# Patient Record
Sex: Male | Born: 2005 | Race: White | Hispanic: No | Marital: Single | Smoking: Never smoker
Health system: Southern US, Community
[De-identification: ages and names within clinical notes are randomized; demographics above are authoritative.]

## PROBLEM LIST (undated history)

## (undated) DIAGNOSIS — F909 Attention-deficit hyperactivity disorder, unspecified type: Secondary | ICD-10-CM

---
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
## 2024-01-28 ENCOUNTER — Ambulatory Visit: Payer: Self-pay

## 2024-01-28 NOTE — Telephone Encounter (Signed)
 CALLED [facility] AND NO AVAILABILITY IN THE OFFICE TODAY Patient's mother did not want to take him to Urgent Care today [facility] set up an appointment for the patient tomorrow morning & they are advised that if anything gets worse   FYI Only or Action Required?: FYI only for provider.  Patient was last seen in primary care on 05/03/2023 by Lucius Krabbe, NP.  Called Nurse Triage reporting Poison Ivy.  Symptoms began over a week ago.  Interventions attempted: OTC medications: didn't specify.  Symptoms are: gradually worsening.  Triage Disposition: See HCP Within 4 Hours (Or PCP Triage)  Patient/caregiver understands and will follow disposition?: No----but this RN called [facility] and patient's mother refused to utilize Urgent Care today and set up an appointment tomorrow morning with PCP office through the [facility]                    Copied from CRM #[id number]. Topic: Clinical - Red Word Triage >> Jan 28, 2024 11:42 AM Macario HERO wrote: Red Word that prompted transfer to Nurse Triage: Patient's mother said that patient had poison oak/ poison ivy and it's spreading really bad all over his face, arms, hands and moving towards his belly. Reason for Disposition  [1] Severe poison ivy, oak, or sumac reaction in the past AND [2] face or genitals involved  Answer Assessment - Initial Assessment Questions Patient has had poison oak/ivy in the past and had to come in to get a steroid shot that helped This has been present for over a week after going into the woods Patient denies any difficulty breathing, swelling of the face/eyes/nose/throat/lips/neck His mother called in but patient is sitting right there with her They are advised that if anything worsens to go to the Emergency Room They verbalized understanding      1. APPEARANCE of RASH: What does the rash look like?      Red, bumps 2. LOCATION: Where is the rash located?  (e.g., face, genitals, hands, legs)     Arms, abdomen, face,  close to left eye 3. SIZE: How large is the rash?      varies 4. ONSET: When did the rash begin?      A little over a week ago 5. ITCHING: Does the rash itch? If Yes, ask: How bad is it?     Severely itching 6. EXPOSURE:  How were you exposed to the plant (poison ivy, poison oak, sumac)  When were you exposed?  Note: Sometimes a poison ivy/oak/sumac rash does not appear for 2 to 3 weeks after exposure.      Went into woods 7. PAST HISTORY: Have you had a poison ivy rash before? If Yes, ask: How bad was it?     Yes---came in for a steroid injection 8. OTHER SYMPTOMS: Do you have any other symptoms? (e.g., fever)      No other symptoms  Protocols used: Poison Ivy - Oak - Sumac-A-AH

## 2024-01-28 NOTE — Telephone Encounter (Signed)
 Appt tomorrow.

## 2024-01-29 ENCOUNTER — Ambulatory Visit: Admitting: Family Medicine

## 2024-02-04 ENCOUNTER — Encounter: Admitting: Physician Assistant

## 2024-04-19 ENCOUNTER — Other Ambulatory Visit: Payer: Self-pay

## 2024-04-19 ENCOUNTER — Encounter (HOSPITAL_COMMUNITY): Payer: Self-pay

## 2024-04-19 ENCOUNTER — Emergency Department (HOSPITAL_COMMUNITY)
Admission: EM | Admit: 2024-04-19 | Discharge: 2024-04-20 | Disposition: A | Discharge status: Home / Self Care | Attending: Emergency Medicine | Admitting: Emergency Medicine

## 2024-04-19 ENCOUNTER — Emergency Department (HOSPITAL_COMMUNITY)

## 2024-04-19 DIAGNOSIS — S022XXA Fracture of nasal bones, initial encounter for closed fracture: Secondary | ICD-10-CM | POA: Diagnosis not present

## 2024-04-19 DIAGNOSIS — R519 Headache, unspecified: Secondary | ICD-10-CM | POA: Diagnosis present

## 2024-04-19 DIAGNOSIS — S270XXA Traumatic pneumothorax, initial encounter: Secondary | ICD-10-CM | POA: Insufficient documentation

## 2024-04-19 DIAGNOSIS — Z23 Encounter for immunization: Secondary | ICD-10-CM | POA: Diagnosis not present

## 2024-04-19 DIAGNOSIS — Y92410 Unspecified street and highway as the place of occurrence of the external cause: Secondary | ICD-10-CM | POA: Diagnosis not present

## 2024-04-19 MED ORDER — TETANUS-DIPHTH-ACELL PERTUSSIS 5-2-15.5 LF-MCG/0.5 IM SUSP
0.5000 mL | Freq: Once | INTRAMUSCULAR | Status: AC
  Administered 2024-04-20: 0.5 mL via INTRAMUSCULAR
  Filled 2024-04-19: qty 0.5

## 2024-04-19 MED ORDER — HYDROMORPHONE HCL 1 MG/ML IJ SOLN: 1.0000 mg | Freq: Once | INTRAMUSCULAR | Status: DC
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
## 2024-04-20 ENCOUNTER — Emergency Department (HOSPITAL_COMMUNITY)

## 2024-04-20 LAB — CBC WITH DIFFERENTIAL/PLATELET
Abs Immature Granulocytes: 0.07 K/uL (ref 0.00–0.07)
Basophils Absolute: 0 K/uL (ref 0.0–0.1)
Basophils Relative: 0 %
Eosinophils Absolute: 0 K/uL (ref 0.0–0.5)
Eosinophils Relative: 0 %
HCT: 42.5 % (ref 39.0–52.0)
Hemoglobin: 14.7 g/dL (ref 13.0–17.0)
Immature Granulocytes: 1 %
Lymphocytes Relative: 20 %
Lymphs Abs: 2.4 K/uL (ref 0.7–4.0)
MCH: 30.2 pg (ref 26.0–34.0)
MCHC: 34.6 g/dL (ref 30.0–36.0)
MCV: 87.4 fL (ref 80.0–100.0)
Monocytes Absolute: 1.1 K/uL — ABNORMAL HIGH (ref 0.1–1.0)
Monocytes Relative: 9 %
Neutro Abs: 8.4 K/uL — ABNORMAL HIGH (ref 1.7–7.7)
Neutrophils Relative %: 70 %
Platelets: 221 K/uL (ref 150–400)
RBC: 4.86 MIL/uL (ref 4.22–5.81)
RDW: 11.6 % (ref 11.5–15.5)
WBC: 12 K/uL — ABNORMAL HIGH (ref 4.0–10.5)
nRBC: 0 % (ref 0.0–0.2)

## 2024-04-20 LAB — COMPREHENSIVE METABOLIC PANEL WITH GFR
ALT: 26 U/L (ref 0–44)
AST: 38 U/L (ref 15–41)
Albumin: 4.5 g/dL (ref 3.5–5.0)
Alkaline Phosphatase: 85 U/L (ref 38–126)
Anion gap: 13 (ref 5–15)
BUN: 15 mg/dL (ref 6–20)
CO2: 24 mmol/L (ref 22–32)
Calcium: 9.3 mg/dL (ref 8.9–10.3)
Chloride: 103 mmol/L (ref 98–111)
Creatinine, Ser: 0.96 mg/dL (ref 0.61–1.24)
GFR, Estimated: >60 mL/min (ref >60)
Glucose, Bld: 123 mg/dL — ABNORMAL HIGH (ref 70–99)
Potassium: 2.9 mmol/L — ABNORMAL LOW (ref 3.5–5.1)
Sodium: 140 mmol/L (ref 135–145)
Total Bilirubin: 0.5 mg/dL (ref 0.0–1.2)
Total Protein: 7.1 g/dL (ref 6.5–8.1)

## 2024-04-20 MED ORDER — OXYCODONE-ACETAMINOPHEN 5-325 MG PO TABS: 1.0000 | ORAL_TABLET | Freq: Four times a day (QID) | ORAL | 0 refills | Status: DC | PRN

## 2024-04-20 MED ORDER — HYDROMORPHONE HCL 1 MG/ML IJ SOLN
1.0000 mg | Freq: Once | INTRAMUSCULAR | Status: AC
  Administered 2024-04-20: 1 mg via INTRAMUSCULAR
  Filled 2024-04-20: qty 1

## 2024-04-20 MED ORDER — IOHEXOL 300 MG/ML  SOLN
100.0000 mL | Freq: Once | INTRAMUSCULAR | Status: AC | PRN
  Administered 2024-04-20: 100 mL via INTRAVENOUS

## 2024-04-20 MED ORDER — HYDROMORPHONE HCL 1 MG/ML IJ SOLN
1.0000 mg | Freq: Once | INTRAMUSCULAR | Status: DC
  Filled 2024-04-20: qty 1
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
## 2024-04-20 NOTE — ED Notes (Signed)
 Patient transported to CT

## 2024-04-20 NOTE — ED Notes (Addendum)
 Pt vomited- says he feels better, -Dr Lorette made aware. pt mother at bedside- pt given gown, and soapy water/wash cloths to clean up with.

## 2024-04-28 ENCOUNTER — Encounter: Payer: Self-pay | Admitting: Physician Assistant

## 2024-04-28 ENCOUNTER — Ambulatory Visit: Admitting: Physician Assistant

## 2024-04-28 ENCOUNTER — Ambulatory Visit (INDEPENDENT_AMBULATORY_CARE_PROVIDER_SITE_OTHER)

## 2024-04-28 DIAGNOSIS — R739 Hyperglycemia, unspecified: Secondary | ICD-10-CM | POA: Diagnosis not present

## 2024-04-28 DIAGNOSIS — S022XXS Fracture of nasal bones, sequela: Secondary | ICD-10-CM

## 2024-04-28 DIAGNOSIS — R040 Epistaxis: Secondary | ICD-10-CM | POA: Diagnosis not present

## 2024-04-28 DIAGNOSIS — R079 Chest pain, unspecified: Secondary | ICD-10-CM

## 2024-04-28 DIAGNOSIS — E876 Hypokalemia: Secondary | ICD-10-CM
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
## 2024-04-29 ENCOUNTER — Telehealth: Payer: Self-pay

## 2024-04-29 ENCOUNTER — Ambulatory Visit: Payer: Self-pay | Admitting: Physician Assistant

## 2024-04-29 LAB — CBC WITH DIFFERENTIAL/PLATELET
Basophils Absolute: 0 K/uL (ref 0.0–0.1)
Basophils Relative: 0.9 % (ref 0.0–3.0)
Eosinophils Absolute: 0.1 K/uL (ref 0.0–0.7)
Eosinophils Relative: 2.1 % (ref 0.0–5.0)
HCT: 44.5 % (ref 36.0–49.0)
Hemoglobin: 15.4 g/dL (ref 12.0–16.0)
Lymphocytes Relative: 39.5 % (ref 24.0–48.0)
Lymphs Abs: 2.3 K/uL (ref 0.7–4.0)
MCHC: 34.5 g/dL (ref 31.0–37.0)
MCV: 89.1 fl (ref 78.0–98.0)
Monocytes Absolute: 0.5 K/uL (ref 0.1–1.0)
Monocytes Relative: 8.7 % (ref 3.0–12.0)
Neutro Abs: 2.8 K/uL (ref 1.4–7.7)
Neutrophils Relative %: 48.8 % (ref 43.0–71.0)
Platelets: 255 K/uL (ref 150.0–575.0)
RBC: 5 Mil/uL (ref 3.80–5.70)
RDW: 12.4 % (ref 11.4–15.5)
WBC: 5.7 K/uL (ref 4.5–13.5)

## 2024-04-29 LAB — COMPREHENSIVE METABOLIC PANEL WITH GFR
ALT: 21 U/L (ref 0–53)
AST: 21 U/L (ref 0–37)
Albumin: 5 g/dL (ref 3.5–5.2)
Alkaline Phosphatase: 96 U/L (ref 52–171)
BUN: 10 mg/dL (ref 6–23)
CO2: 29 meq/L (ref 19–32)
Calcium: 9.9 mg/dL (ref 8.4–10.5)
Chloride: 101 meq/L (ref 96–112)
Creatinine, Ser: 0.94 mg/dL (ref 0.40–1.50)
GFR: 117.62 mL/min (ref >60.00)
Glucose, Bld: 93 mg/dL (ref 70–99)
Potassium: 4 meq/L (ref 3.5–5.1)
Sodium: 140 meq/L (ref 135–145)
Total Bilirubin: 0.5 mg/dL (ref 0.2–1.2)
Total Protein: 8 g/dL (ref 6.0–8.3)

## 2024-04-29 LAB — HEMOGLOBIN A1C: Hgb A1c MFr Bld: 5.5 % (ref 4.6–6.5)

## 2024-04-29 LAB — IBC + FERRITIN
Ferritin: 77.2 ng/mL (ref 22.0–322.0)
Iron: 82 ug/dL (ref 42–165)
Saturation Ratios: 22.4 % (ref 20.0–50.0)
TIBC: 365.4 ug/dL (ref 250.0–450.0)
Transferrin: 261 mg/dL (ref 212.0–360.0)

## 2024-04-29 NOTE — Telephone Encounter (Signed)
 Copied from CRM [id number]. Topic: Referral - Question >> Apr 29, 2024 11:25 AM Burnard DEL wrote: Reason for CRM: [facility] ENT called in stating that they received referral on behalf d patient from [facility] since patient was seen in ED ,at that time [facility] was the office on call and patient has to FU with [facility] EN per hospital protocol.. They have tried to call patient but can't get in contact with him.  Per patient Mom pt isn't scheduled to see [facility] until 05/22/24 and that is consultation only. [facility] and got patient worked in to see Dr Jesus tomorrow 04/30/24 at 10:40am. Pt Mom has been advised of this appt and location.

## 2024-04-30 NOTE — Telephone Encounter (Signed)
 Noted and agreed, thank you.

## 2024-06-17 ENCOUNTER — Other Ambulatory Visit (HOSPITAL_BASED_OUTPATIENT_CLINIC_OR_DEPARTMENT_OTHER): Payer: Self-pay

## 2024-06-17 ENCOUNTER — Encounter: Payer: Self-pay | Admitting: Physician Assistant

## 2024-06-17 ENCOUNTER — Other Ambulatory Visit: Payer: Self-pay

## 2024-06-17 MED ORDER — OMEPRAZOLE 20 MG PO CPDR
20.0000 mg | DELAYED_RELEASE_CAPSULE | Freq: Every day | ORAL | 3 refills | Status: AC
  Filled 2024-06-17: qty 30, 30d supply, fill #0

## 2024-06-17 MED ORDER — CETIRIZINE HCL 10 MG PO TABS
10.0000 mg | ORAL_TABLET | Freq: Every day | ORAL | 3 refills | Status: AC
  Filled 2024-06-17: qty 90, 90d supply, fill #0

## 2024-07-22 ENCOUNTER — Other Ambulatory Visit: Payer: Self-pay | Admitting: Otolaryngology

## 2024-07-24 ENCOUNTER — Encounter (HOSPITAL_COMMUNITY): Payer: Self-pay | Admitting: Otolaryngology

## 2024-07-24 ENCOUNTER — Other Ambulatory Visit: Payer: Self-pay

## 2024-07-24 NOTE — Progress Notes (Signed)
 SDW CALL  Patient was given pre-op instructions over the phone. The opportunity was given for the patient to ask questions. No further questions asked. Patient verbalized understanding of instructions given.  Date & arrival time- July 29, 2024 @ 5:30 am.  PCP - Allwardt, Mardy HERO, PA-C  Cardiologist -   PPM/ICD - denies Device Orders - n/a Rep Notified - n/a  Chest x-ray - denies EKG - denies Stress Test - denies ECHO - denies Cardiac Cath - denies  Sleep Study - denies CPAP - n/a  DM -denies  Blood Thinner Instructions: denies Aspirin Instructions:denies  ERAS Protcol - NPO per mom   COVID TEST- n/a   Anesthesia review: no  Patient denies shortness of breath, fever, cough and chest pain over the phone call   All instructions explained to the patient, with a verbal understanding of the material. Patient agrees to go over the instructions while at home for a better understanding.

## 2024-07-28 NOTE — Anesthesia Preprocedure Evaluation (Signed)
"                                    Anesthesia Evaluation  Patient identified by MRN, date of birth, ID band Patient awake    Reviewed: Allergy & Precautions, NPO status , Patient's Chart, lab work & pertinent test results  History of Anesthesia Complications Negative for: history of anesthetic complications  Airway Mallampati: II  TM Distance: >3 FB Neck ROM: Full    Dental  (+) Dental Advisory Given   Pulmonary neg pulmonary ROS   breath sounds clear to auscultation       Cardiovascular (-) angina negative cardio ROS  Rhythm:Regular Rate:Normal     Neuro/Psych  Headaches PSYCHIATRIC DISORDERS (ADHD)         GI/Hepatic Neg liver ROS,GERD  Controlled and Medicated,,  Endo/Other  negative endocrine ROS    Renal/GU negative Renal ROS     Musculoskeletal   Abdominal   Peds  Hematology negative hematology ROS (+)   Anesthesia Other Findings S/p cleft lip and palate repair, has naso-palatal fistula  Reproductive/Obstetrics                              Anesthesia Physical Anesthesia Plan  ASA: 2  Anesthesia Plan: General   Post-op Pain Management: Tylenol  PO (pre-op)*   Induction: Intravenous  PONV Risk Score and Plan: 2 and Ondansetron  and Dexamethasone   Airway Management Planned: Oral ETT  Additional Equipment: None  Intra-op Plan:   Post-operative Plan: Extubation in OR  Informed Consent: I have reviewed the patients History and Physical, chart, labs and discussed the procedure including the risks, benefits and alternatives for the proposed anesthesia with the patient or authorized representative who has indicated his/her understanding and acceptance.     Dental advisory given  Plan Discussed with: CRNA and Surgeon  Anesthesia Plan Comments:          Anesthesia Quick Evaluation  "

## 2024-07-29 ENCOUNTER — Encounter (HOSPITAL_COMMUNITY): Payer: Self-pay | Admitting: Otolaryngology

## 2024-07-29 ENCOUNTER — Encounter (HOSPITAL_COMMUNITY): Admitting: Anesthesiology

## 2024-07-29 ENCOUNTER — Other Ambulatory Visit: Payer: Self-pay

## 2024-07-29 ENCOUNTER — Ambulatory Visit (HOSPITAL_COMMUNITY)
Admission: RE | Admit: 2024-07-29 | Discharge: 2024-07-29 | Disposition: A | Discharge status: Home / Self Care | Attending: Otolaryngology | Admitting: Otolaryngology

## 2024-07-29 ENCOUNTER — Ambulatory Visit (HOSPITAL_BASED_OUTPATIENT_CLINIC_OR_DEPARTMENT_OTHER): Admitting: Anesthesiology

## 2024-07-29 ENCOUNTER — Encounter (HOSPITAL_COMMUNITY)
Admission: RE | Disposition: A | Discharge status: Home / Self Care | Payer: Self-pay | Source: Home / Self Care | Attending: Otolaryngology

## 2024-07-29 ENCOUNTER — Other Ambulatory Visit (HOSPITAL_BASED_OUTPATIENT_CLINIC_OR_DEPARTMENT_OTHER): Payer: Self-pay

## 2024-07-29 DIAGNOSIS — J342 Deviated nasal septum: Secondary | ICD-10-CM | POA: Insufficient documentation

## 2024-07-29 DIAGNOSIS — Q302 Fissured, notched and cleft nose: Secondary | ICD-10-CM | POA: Insufficient documentation

## 2024-07-29 DIAGNOSIS — J34829 Nasal valve collapse, unspecified: Secondary | ICD-10-CM | POA: Insufficient documentation

## 2024-07-29 DIAGNOSIS — Z79899 Other long term (current) drug therapy: Secondary | ICD-10-CM | POA: Insufficient documentation

## 2024-07-29 DIAGNOSIS — K219 Gastro-esophageal reflux disease without esophagitis: Secondary | ICD-10-CM | POA: Diagnosis not present

## 2024-07-29 DIAGNOSIS — Q379 Unspecified cleft palate with unilateral cleft lip: Secondary | ICD-10-CM | POA: Diagnosis present

## 2024-07-29 DIAGNOSIS — M950 Acquired deformity of nose: Secondary | ICD-10-CM | POA: Diagnosis not present

## 2024-07-29 DIAGNOSIS — F909 Attention-deficit hyperactivity disorder, unspecified type: Secondary | ICD-10-CM | POA: Diagnosis not present

## 2024-07-29 DIAGNOSIS — J343 Hypertrophy of nasal turbinates: Secondary | ICD-10-CM | POA: Insufficient documentation

## 2024-07-29 HISTORY — DX: Attention-deficit hyperactivity disorder, unspecified type: F90.9

## 2024-07-29 LAB — CBC
HCT: 43 % (ref 39.0–52.0)
Hemoglobin: 15.2 g/dL (ref 13.0–17.0)
MCH: 31 pg (ref 26.0–34.0)
MCHC: 35.3 g/dL (ref 30.0–36.0)
MCV: 87.6 fL (ref 80.0–100.0)
Platelets: 218 10*3/uL (ref 150–400)
RBC: 4.91 MIL/uL (ref 4.22–5.81)
RDW: 11.6 % (ref 11.5–15.5)
WBC: 5.7 10*3/uL (ref 4.0–10.5)
nRBC: 0 % (ref 0.0–0.2)

## 2024-07-29 MED ORDER — MEPERIDINE HCL 25 MG/ML IJ SOLN: 6.2500 mg | INTRAMUSCULAR | Status: DC | PRN

## 2024-07-29 MED ORDER — ACETAMINOPHEN 500 MG PO TABS
ORAL_TABLET | ORAL | Status: AC
  Administered 2024-07-29: 1000 mg via ORAL
  Filled 2024-07-29: qty 2

## 2024-07-29 MED ORDER — PHENYLEPHRINE 80 MCG/ML (10ML) SYRINGE FOR IV PUSH (FOR BLOOD PRESSURE SUPPORT)
PREFILLED_SYRINGE | INTRAVENOUS | Status: AC
  Filled 2024-07-29: qty 10

## 2024-07-29 MED ORDER — HYDROMORPHONE HCL 1 MG/ML IJ SOLN
INTRAMUSCULAR | Status: AC
  Filled 2024-07-29: qty 1

## 2024-07-29 MED ORDER — MUPIROCIN 2 % EX OINT
TOPICAL_OINTMENT | CUTANEOUS | Status: AC
  Filled 2024-07-29: qty 22

## 2024-07-29 MED ORDER — FLUORESCEIN SODIUM 1 MG OP STRP
ORAL_STRIP | OPHTHALMIC | Status: AC
  Filled 2024-07-29: qty 3

## 2024-07-29 MED ORDER — GENTAMICIN SULFATE 40 MG/ML IJ SOLN
INTRAMUSCULAR | Status: AC
  Filled 2024-07-29: qty 2

## 2024-07-29 MED ORDER — FENTANYL CITRATE (PF) 250 MCG/5ML IJ SOLN
INTRAMUSCULAR | Status: DC | PRN
  Administered 2024-07-29: 100 ug via INTRAVENOUS
  Administered 2024-07-29 (×3): 50 ug via INTRAVENOUS

## 2024-07-29 MED ORDER — LIDOCAINE 2% (20 MG/ML) 5 ML SYRINGE
INTRAMUSCULAR | Status: AC
  Filled 2024-07-29: qty 5

## 2024-07-29 MED ORDER — CHLORHEXIDINE GLUCONATE 0.12 % MT SOLN
OROMUCOSAL | Status: AC
  Administered 2024-07-29: 15 mL via OROMUCOSAL
  Filled 2024-07-29: qty 15

## 2024-07-29 MED ORDER — DEXAMETHASONE SOD PHOSPHATE PF 10 MG/ML IJ SOLN
INTRAMUSCULAR | Status: AC
  Filled 2024-07-29: qty 1

## 2024-07-29 MED ORDER — LIDOCAINE 2% (20 MG/ML) 5 ML SYRINGE
INTRAMUSCULAR | Status: DC | PRN
  Administered 2024-07-29: 60 mg via INTRAVENOUS

## 2024-07-29 MED ORDER — DEXAMETHASONE SOD PHOSPHATE PF 10 MG/ML IJ SOLN
INTRAMUSCULAR | Status: DC | PRN
  Administered 2024-07-29: 10 mg via INTRAVENOUS

## 2024-07-29 MED ORDER — FLUORESCEIN SODIUM 1 MG OP STRP
ORAL_STRIP | OPHTHALMIC | Status: DC | PRN
  Administered 2024-07-29: 1

## 2024-07-29 MED ORDER — EPINEPHRINE HCL (NASAL) 0.1 % NA SOLN
NASAL | Status: AC
  Filled 2024-07-29: qty 30

## 2024-07-29 MED ORDER — OXYCODONE HCL 5 MG PO TABS: 5.0000 mg | ORAL_TABLET | Freq: Once | ORAL | Status: DC | PRN

## 2024-07-29 MED ORDER — CEFAZOLIN SODIUM-DEXTROSE 2-3 GM-%(50ML) IV SOLR
INTRAVENOUS | Status: DC | PRN
  Administered 2024-07-29: 2 g via INTRAVENOUS

## 2024-07-29 MED ORDER — SALINE SPRAY 0.65 % NA SOLN
2.0000 | Freq: Four times a day (QID) | NASAL | 0 refills | Status: AC
  Filled 2024-07-29: qty 44, 30d supply, fill #0

## 2024-07-29 MED ORDER — OXYCODONE HCL 5 MG/5ML PO SOLN: 5.0000 mg | Freq: Once | ORAL | Status: DC | PRN

## 2024-07-29 MED ORDER — EPHEDRINE 5 MG/ML INJ
INTRAVENOUS | Status: AC
  Filled 2024-07-29: qty 5

## 2024-07-29 MED ORDER — METHYLPREDNISOLONE 4 MG PO TBPK
ORAL_TABLET | ORAL | 0 refills | Status: AC
  Filled 2024-07-29: qty 21, 6d supply, fill #0

## 2024-07-29 MED ORDER — ONDANSETRON HCL 4 MG/2ML IJ SOLN
INTRAMUSCULAR | Status: DC | PRN
  Administered 2024-07-29: 4 mg via INTRAVENOUS

## 2024-07-29 MED ORDER — CHLORHEXIDINE GLUCONATE 0.12 % MT SOLN: 15.0000 mL | Freq: Once | OROMUCOSAL | Status: AC

## 2024-07-29 MED ORDER — ONDANSETRON HCL 4 MG/2ML IJ SOLN
INTRAMUSCULAR | Status: AC
  Filled 2024-07-29: qty 2

## 2024-07-29 MED ORDER — ORAL CARE MOUTH RINSE: 15.0000 mL | Freq: Once | OROMUCOSAL | Status: AC

## 2024-07-29 MED ORDER — BACITRACIN ZINC 500 UNIT/GM EX OINT
TOPICAL_OINTMENT | CUTANEOUS | Status: AC
  Filled 2024-07-29: qty 28.35

## 2024-07-29 MED ORDER — OXYCODONE HCL 5 MG PO TABS
5.0000 mg | ORAL_TABLET | Freq: Four times a day (QID) | ORAL | 0 refills | Status: AC | PRN
  Filled 2024-07-29: qty 5, 2d supply, fill #0

## 2024-07-29 MED ORDER — ACETAMINOPHEN 500 MG PO TABS: 1000.0000 mg | ORAL_TABLET | Freq: Once | ORAL | Status: AC

## 2024-07-29 MED ORDER — MIDAZOLAM HCL 2 MG/2ML IJ SOLN
INTRAMUSCULAR | Status: AC
  Filled 2024-07-29: qty 2

## 2024-07-29 MED ORDER — MUPIROCIN 2 % EX OINT
1.0000 | TOPICAL_OINTMENT | Freq: Two times a day (BID) | CUTANEOUS | 0 refills | Status: AC
  Filled 2024-07-29: qty 22, 14d supply, fill #0

## 2024-07-29 MED ORDER — HYDROMORPHONE HCL 1 MG/ML IJ SOLN
INTRAMUSCULAR | Status: DC | PRN
  Administered 2024-07-29: .5 mg via INTRAVENOUS

## 2024-07-29 MED ORDER — SODIUM CHLORIDE 0.9 % IR SOLN
Status: DC | PRN
  Administered 2024-07-29: 2000 mL

## 2024-07-29 MED ORDER — MIDAZOLAM HCL (PF) 2 MG/2ML IJ SOLN
INTRAMUSCULAR | Status: DC | PRN
  Administered 2024-07-29: 2 mg via INTRAVENOUS

## 2024-07-29 MED ORDER — ROCURONIUM BROMIDE 10 MG/ML (PF) SYRINGE
PREFILLED_SYRINGE | INTRAVENOUS | Status: AC
  Filled 2024-07-29: qty 10

## 2024-07-29 MED ORDER — MIDAZOLAM HCL (PF) 2 MG/2ML IJ SOLN: 0.5000 mg | Freq: Once | INTRAMUSCULAR | Status: DC | PRN

## 2024-07-29 MED ORDER — HYDROMORPHONE HCL 1 MG/ML IJ SOLN
INTRAMUSCULAR | Status: AC
  Filled 2024-07-29: qty 0.5

## 2024-07-29 MED ORDER — BACITRACIN ZINC 500 UNIT/GM EX OINT
TOPICAL_OINTMENT | CUTANEOUS | Status: DC | PRN
  Administered 2024-07-29: 1 via TOPICAL

## 2024-07-29 MED ORDER — SUZETRIGINE 50 MG PO TABS
50.0000 mg | ORAL_TABLET | Freq: Two times a day (BID) | ORAL | 0 refills | Status: AC
  Filled 2024-07-29: qty 14, 7d supply, fill #0

## 2024-07-29 MED ORDER — DEXMEDETOMIDINE HCL IN NACL 80 MCG/20ML IV SOLN
INTRAVENOUS | Status: AC
  Filled 2024-07-29: qty 20

## 2024-07-29 MED ORDER — EPINEPHRINE 0.1 % (1MG/ML) IJ FOR NASAL USE
INTRAMUSCULAR | Status: DC | PRN
  Administered 2024-07-29: 1 [drp] via TOPICAL

## 2024-07-29 MED ORDER — BSS IO SOLN
INTRAOCULAR | Status: AC
  Filled 2024-07-29: qty 30

## 2024-07-29 MED ORDER — SUGAMMADEX SODIUM 200 MG/2ML IV SOLN
INTRAVENOUS | Status: DC | PRN
  Administered 2024-07-29: 200 mg via INTRAVENOUS

## 2024-07-29 MED ORDER — HYDROMORPHONE HCL 1 MG/ML IJ SOLN
0.2500 mg | INTRAMUSCULAR | Status: DC | PRN
  Administered 2024-07-29 (×3): 0.5 mg via INTRAVENOUS
  Administered 2024-07-29 (×2): 0.25 mg via INTRAVENOUS

## 2024-07-29 MED ORDER — LIDOCAINE-EPINEPHRINE 1 %-1:100000 IJ SOLN
INTRAMUSCULAR | Status: DC | PRN
  Administered 2024-07-29: 10 mL

## 2024-07-29 MED ORDER — LACTATED RINGERS IV SOLN: INTRAVENOUS | Status: DC

## 2024-07-29 MED ORDER — PROPOFOL 10 MG/ML IV BOLUS
INTRAVENOUS | Status: AC
  Filled 2024-07-29: qty 20

## 2024-07-29 MED ORDER — PROPOFOL 10 MG/ML IV BOLUS
INTRAVENOUS | Status: DC | PRN
  Administered 2024-07-29: 200 mg via INTRAVENOUS

## 2024-07-29 MED ORDER — DEXMEDETOMIDINE HCL IN NACL 80 MCG/20ML IV SOLN
INTRAVENOUS | Status: DC | PRN
  Administered 2024-07-29 (×2): 4 ug via INTRAVENOUS
  Administered 2024-07-29: 8 ug via INTRAVENOUS
  Administered 2024-07-29: 4 ug via INTRAVENOUS

## 2024-07-29 MED ORDER — 0.9 % SODIUM CHLORIDE (POUR BTL) OPTIME
TOPICAL | Status: DC | PRN
  Administered 2024-07-29: 1000 mL

## 2024-07-29 MED ORDER — LIDOCAINE-EPINEPHRINE 1 %-1:100000 IJ SOLN
INTRAMUSCULAR | Status: AC
  Filled 2024-07-29: qty 2

## 2024-07-29 MED ORDER — PHENYLEPHRINE 80 MCG/ML (10ML) SYRINGE FOR IV PUSH (FOR BLOOD PRESSURE SUPPORT)
PREFILLED_SYRINGE | INTRAVENOUS | Status: DC | PRN
  Administered 2024-07-29: 160 ug via INTRAVENOUS

## 2024-07-29 MED ORDER — ROCURONIUM BROMIDE 10 MG/ML (PF) SYRINGE
PREFILLED_SYRINGE | INTRAVENOUS | Status: DC | PRN
  Administered 2024-07-29: 60 mg via INTRAVENOUS
  Administered 2024-07-29: 30 mg via INTRAVENOUS
  Administered 2024-07-29 (×2): 20 mg via INTRAVENOUS

## 2024-07-29 MED ORDER — FENTANYL CITRATE (PF) 250 MCG/5ML IJ SOLN
INTRAMUSCULAR | Status: AC
  Filled 2024-07-29: qty 5

## 2024-07-29 NOTE — Anesthesia Postprocedure Evaluation (Signed)
"   Anesthesia Post Note  Patient: Edgar A Schipani Jr.  Procedure(s) Performed: RECONSTRUCTION, NOSE, WITH NASAL SEPTUM REPAIR (Bilateral: Face) SEPTOPLASTY, NOSE, WITH NASAL TURBINATE REDUCTION (Bilateral: Face)     Patient location during evaluation: PACU Anesthesia Type: General Level of consciousness: awake and alert, oriented and patient cooperative Pain management: pain level controlled Vital Signs Assessment: post-procedure vital signs reviewed and stable Respiratory status: spontaneous breathing, nonlabored ventilation and respiratory function stable Cardiovascular status: blood pressure returned to baseline and stable Postop Assessment: no apparent nausea or vomiting and able to ambulate Anesthetic complications: no   No notable events documented.  Last Vitals:  Vitals:   07/29/24 1415 07/29/24 1430  BP: (!) 143/88 (!) 145/91  Pulse: 95 98  Resp: 16 17  Temp:  37 C  SpO2: 94% 97%    Last Pain:  Vitals:   07/29/24 1430  TempSrc:   PainSc: 4                  Dantavious Snowball,E. Kody Brandl      "

## 2024-07-29 NOTE — Transfer of Care (Signed)
 Immediate Anesthesia Transfer of Care Note  Patient: Edgar Krutz Daley Jr.  Procedure(s) Performed: RECONSTRUCTION, NOSE, WITH NASAL SEPTUM REPAIR (Bilateral: Face) SEPTOPLASTY, NOSE, WITH NASAL TURBINATE REDUCTION (Bilateral: Face)  Patient Location: PACU  Anesthesia Type:General  Level of Consciousness: awake, alert , and oriented  Airway & Oxygen Therapy: Patient Spontanous Breathing and Patient connected to face mask oxygen  Post-op Assessment: Report given to RN and Post -op Vital signs reviewed and stable  Post vital signs: Reviewed and stable  Last Vitals:  Vitals Value Taken Time  BP 122/67 07/29/24 12:37  Temp    Pulse 107 07/29/24 12:41  Resp 18 07/29/24 12:41  SpO2 95 % 07/29/24 12:41  Vitals shown include unfiled device data.  Last Pain:  Vitals:   07/29/24 0613  TempSrc: Oral  PainSc:          Complications: No notable events documented.

## 2024-07-29 NOTE — Anesthesia Procedure Notes (Signed)
 Procedure Name: Intubation Date/Time: 07/29/2024 7:51 AM  Performed by: Elby Raelene SAUNDERS, CRNAPre-anesthesia Checklist: Patient identified, Emergency Drugs available, Suction available and Patient being monitored Patient Re-evaluated:Patient Re-evaluated prior to induction Oxygen Delivery Method: Circle System Utilized Preoxygenation: Pre-oxygenation with 100% oxygen Induction Type: IV induction Ventilation: Mask ventilation without difficulty Laryngoscope Size: Miller and 2 Grade View: Grade I Tube type: Oral Tube size: 7.5 mm Number of attempts: 1 Airway Equipment and Method: Stylet Placement Confirmation: ETT inserted through vocal cords under direct vision, positive ETCO2 and breath sounds checked- equal and bilateral Secured at: 23 cm Tube secured with: Tape Dental Injury: Teeth and Oropharynx as per pre-operative assessment

## 2024-07-29 NOTE — H&P (Signed)
 Edgar A Brumley Mickey. is an 19 y.o. male.    Chief Complaint:  Nasal deformity, nasal obstruction  HPI: Patient presents today for planned elective procedure.  He/she denies any interval change in history since office visit on 07/21/24.   Past Medical History:  Diagnosis Date   ADHD (attention deficit hyperactivity disorder)     History reviewed. No pertinent surgical history.  History reviewed. No pertinent family history.  Social History:  reports that he has never smoked. He has never used smokeless tobacco. He reports that he does not drink alcohol  and does not use drugs.  Allergies: Allergies[1]  Medications Prior to Admission  Medication Sig Dispense Refill   cetirizine  (ZYRTEC ) 10 MG tablet Take 1 tablet (10 mg total) by mouth daily. 90 tablet 3   Multiple Vitamins-Minerals (MULTIVITAL PO) Take 1 tablet by mouth daily.     omeprazole  (PRILOSEC) 20 MG capsule Take 1 capsule (20 mg total) by mouth daily. 90 capsule 3   nortriptyline  (PAMELOR ) 25 MG capsule TAKE 1 CAPSULE BY MOUTH AT BEDTIME. (Patient taking differently: Take 25 mg by mouth at bedtime. PRN) 90 capsule 1    Results for orders placed or performed during the hospital encounter of 07/29/24 (from the past 48 hours)  CBC per protocol     Status: None   Collection Time: 07/29/24  5:50 AM  Result Value Ref Range   WBC 5.7 4.0 - 10.5 K/uL   RBC 4.91 4.22 - 5.81 MIL/uL   Hemoglobin 15.2 13.0 - 17.0 g/dL   HCT 56.9 60.9 - 47.9 %   MCV 87.6 80.0 - 100.0 fL   MCH 31.0 26.0 - 34.0 pg   MCHC 35.3 30.0 - 36.0 g/dL   RDW 88.3 88.4 - 84.4 %   Platelets 218 150 - 400 K/uL   nRBC 0.0 0.0 - 0.2 %    Comment: Performed at [facility], [address], [place], [place] [address]   No results found.  ROS: negative other than stated in HPI  Blood pressure 129/78, pulse 75, temperature 98.1 F (36.7 C), temperature source Oral, resp. rate 18, height 6' (1.829 m), weight 69.9 kg, SpO2 98%.  PHYSICAL EXAM: General:  Resting comfortably in NAD  Lungs: Non-labored respiratinos  Studies Reviewed: None   Assessment/Plan Cleft lip nasal deformity Deviated nasal septum Nasal valve stenosis Inferior turbinate hypertrophy  Proceed with open septorhinoplasty with inferior turbinate reductions, possible cadaveric rib graft. Informed consent obtained.     Electronically signed by:  Elspeth Coddington, MD  Facial Plastic & Reconstructive Surgery Otolaryngology - Head and Neck Surgery [facility]  07/29/2024, 7:29 AM       [1]  Allergies Allergen Reactions   Penicillins Dermatitis   Amoxicillin Rash   Other     Seasonal allergies
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
## 2024-07-29 NOTE — Discharge Instructions (Signed)
 ELSPETH KANDICE CODDINGTON MD  FACIAL PLASTIC AND RECONSTRUCTIVE SURGERY    AFTER RHINOPLASTY SURGERY   After rhinoplasty surgery your nose will have silicone doyle splints secured inside.    The nasal splints will impact breathing through your nose so you may have to breathe through your mouth. Your mouth will become very dry. Please drink as much fluid as you can which will help you from becoming dehydrated. Drinks at the bedside along with a humidifier (cool or warm) may help. You will have a gauze drip pad placed beneath your nose. Change this as needed for the first 24 hours following rhinoplasty surgery. It is not uncommon to change this every 15 minutes for the first several hours following rhinoplasty. If you completely saturate the pad with bright red blood every five minutes, please call your surgeon at the numbers provided.  The cast must remain on your nose for one week after rhinoplasty surgery. It must be kept dry or it could become loose. Notify Dr. Coddington immediately if the cast falls off.  Activity Sleep with head of the bed elevated or use two to three pillows. Sneeze with your mouth open and do not blow your nose of sniff for seven days after your rhinoplasty procedure. Absolutely no bending, lifting or straining. If you have little children, bend at the knees or sit on the floor and let them climb on to your lap.  Ensure that care is taken to keep the cast dry while bathing is important.   Diet Advance diet from liquids to soft food to your regular diet as tolerated. In the immediate rhinoplasty postoperative period, avoid extremely hot liquids or foods if you experience temporary numbness on the roof of the mouth.    Ice During the day and evening of surgery, cold moist compresses are used continuously over the eyes to minimize swelling and control bruising. Puffiness and bruising can occur but if present usually regresses quickly over the next few days. There are several techniques for icing  which are effective. The glove with ice and cool compresses are the preferred methods.  Icing for 24-48 hours is recommended, icing after this period can be used for comfort.  See video on right.   Breathing:  Invariably, there is some nasal stuffiness during the week after surgery. The external edema (swelling) is reflected internally, but the mild blockage will improve steadily. it is imperative to avoid extensive manipulation inside of the nose. DO NOT BLOW THE NOSE. DO NOT USE NOSE DECONGESTANT DROPS (AFFRIN) unless advised by your doctor.   Pain:  Discomfort following rhinoplasty is usually limited to the two or three hours just after the procedure. It may best be described as a headache. The prescription for pain tablets that you have received is more precautionary that necessary, but please have it filled and available at your home bedside. Take pain medicine with milk to avoid any stomach upset. Most patients switch to extra strength Tylenol  on the first day of recovery.  Adult Post-Operative Pain Management  Pain medication is given immediately following your surgery to help with post-operative pain. Upon your discharge home, we suggest scheduled doses of Acetaminophen  (tylenol ) every 6 hours and Ibuprofen (Motrin) every 6 hours, alternating between medications every 3 hours (i.e. Take tylenol  and wait 3 hours, then take Motrin and wait 3 hours, repeat) for the first 3-4 days after surgery. If you are without significant pain, medications can be taken more infrequently. It is important to follow dosing instructions on  the medication bottle or prescription.   Sample of medication dosing schedule  Give dose of: Time: Given:  Acetaminophen  12 a.m.   Ibuprofen 3 a.m.   Acetaminophen  6 a.m.   Ibuprofen 9 a.m.   Acetaminophen  12 p.m.   Ibuprofen 3 p.m.   Acetaminophen  6 p.m.   Ibuprofen 9 p.m.       Medications Most patients complain of pressure from swelling and congestion more than  pain. Use pain medication (most commonly Vicodin/hydrocodone) as directed/as needed. Vicodin contains Tylenol . Do not take additional Tylenol  or acetaminophen  while taking Vicodin.    Do not drive or drink alcohol  while taking pain medication.    Side effects of pain medications can include nausea and constipation. Taking pain medication with food can minimize nausea. Over-the-counter laxatives are indicated if constipation persists.   Start the prescription for swelling medication if prescribed (most commonly Medrol  Dosepak/methyl prednisolone) when you arrive home following rhinoplasty surgery.   The evening following surgery start using the ointment (most commonly polysporin/bacitracin ) two times a day (morning and evening) inside the base of each nostril. Insert only the cotton part of the Q-tip into your nose. Ointment is applied after the morning and evening saline rinses.   Please Remember! Nasal congestion, facial fullness, headache and disrupted sleep are very normal postoperative symptoms for rhinoplasty and will decrease as the healing process occurs. It is not uncommon to have numbness on the roof of the mouth (palate) behind the front teeth. Therefore avoid extremely hot liquids or food in the immediate rhinoplasty postoperative period.  After the First Week: Nasal Appearance:  Prior to your first post-operative visit please allow your nose to get wet in the shower; this will facilitate a more comfortable dressing removal. At the time of nasal splint removal, you will have your chance to see the new nose. It will appear quite swollen but, in most cases, even in this swollen condition, the improvement can be appreciated. It takes time for the skin and soft tissue to adhere to the new framework.  During your postoperative visits Dr. Luciano may use medicines to help contour the shape of your nose.    Final results following rhinoplasty are not apparent for one full year following  surgery.  After three months, the changes are ever so subtle, although still important. Being perfectionists about our work, you may tell us  you are pleased long before the one year anniversary. However, we request that you follow-up with us  at that time for postoperative rhinoplasty photographs and so that we can enjoy your final result.    Activity Sleep with head of the bed elevated or use two to three pillows for three weeks after surgery.  Three weeks after surgery you may resume full activity without medical restrictions.  It is advised that you resume your work-out regimen slowly, as your body may fatigue a little easier than usual.  Wearing Glasses:  You should not wear glasses for at least one month. If glasses must be worn, taping the central bridge of the glasses to the forehead will allow as little pressure as possible on the nasal bones.    Sun Exposure:  Your skin should be protected from sun exposure for at least six months after surgery. A sunburn will cause the nose to swell dramatically and delay the final result. [place] avoidance or protection with a hat is preferred.  You may begin wearing sun block three weeks after surgery. Factor 30SPF sun block with both UVA and UVB  protection.     General Postoperative Medication Checklist 1.            Pain reliever    2.            Medrol  Dose Pack (possible)   3.            Nasal saline spray: purchased over the counter   4.            Antibiotic ointment: purchased over the counter   AFTER SURGERY CHECKLIST Immediately after the procedure  You must meet nursing criteria for discharge home; walking, voiding, talking. Your nose may have small packings inside the nostrils.  These control any bleeding after surgery but may partially obstruct your nasal breathing.     The First 24 Hours 1.            Wound care: cleaning the sutures with a Q-tip dipped in saline water and then apply the antibiotic ointment (polysporin/bacitracin )  twice a day.   2.            Activity: sleep with head of the bed elevated or use two to three pillows.  No strenuous activity / aerobics / yoga / heavy lifting for 3 weeks after surgery.   3.            Bathing is ok as long as you don't get your incisions wet for a minimum of four days after surgery.   4.            Advance diet from liquids to soft food to your regular diet as tolerated.   5.            Cold compresses are used continuously over the forehead to minimize swelling and control bruising (30 minutes on, 15 minutes off) for the first 48 hours after surgery.   6.            Medications: use pain medication as necessary.  You can have a maximum of 8 tablets of vicodin + Tylenol  in a 24 hour period (6 vicodin + 2 Tylenol  = 8, ect).     7.            Medications: Start Medrol  Dosepak/methyl prednisolone when you arrive home and feeling ok to take medicine.   8.            Medications: Start the nasal saline spray 1 day after surgery.     One Week After Surgery 1.            Make-up can be started 1 week after surgery to camouflage any bruising or redness.   2.            Avoid sun exposure for three weeks after surgery, then use sun block.   3.            The tip of the nose and sometimes the front teeth will be numb to touch following surgery.  This will improve in the first few weeks to months after surgery.   4.            Swelling, bruising and disrupted sleep are very normal postoperative symptoms and will decrease as the healing process occurs.   5.            Differential swelling may asymmetries of the right and left sides of your nose.  As the swelling goes away, so will these asymmetries.  Please be patient.   If you need to  call after clinic hours for a concern, call [phone]  [facility] [place] [address] [place], [place], [address] [phone]

## 2024-07-30 ENCOUNTER — Encounter (HOSPITAL_COMMUNITY): Payer: Self-pay | Admitting: Otolaryngology

## 2024-07-30 ENCOUNTER — Other Ambulatory Visit (HOSPITAL_BASED_OUTPATIENT_CLINIC_OR_DEPARTMENT_OTHER): Payer: Self-pay

## 2024-08-03 ENCOUNTER — Other Ambulatory Visit (HOSPITAL_BASED_OUTPATIENT_CLINIC_OR_DEPARTMENT_OTHER): Payer: Self-pay
